# Patient Record
Sex: Female | Born: 1960 | Race: Black or African American | Hispanic: No | Smoking: Never smoker
Health system: Southern US, Community
[De-identification: ages and names within clinical notes are randomized; demographics above are authoritative.]

## PROBLEM LIST (undated history)

## (undated) DIAGNOSIS — I10 Essential (primary) hypertension: Secondary | ICD-10-CM

---

## 2006-07-22 ENCOUNTER — Emergency Department: Payer: Self-pay | Admitting: Unknown Physician Specialty

## 2007-01-12 ENCOUNTER — Ambulatory Visit: Payer: Self-pay | Admitting: *Deleted

## 2008-09-26 ENCOUNTER — Emergency Department: Payer: Self-pay | Admitting: Emergency Medicine

## 2009-05-03 ENCOUNTER — Ambulatory Visit: Payer: Self-pay | Admitting: Family Medicine

## 2010-05-08 ENCOUNTER — Ambulatory Visit: Payer: Self-pay | Admitting: Family Medicine

## 2010-05-10 ENCOUNTER — Ambulatory Visit: Payer: Self-pay | Admitting: Family Medicine

## 2010-11-11 ENCOUNTER — Ambulatory Visit: Payer: Self-pay | Admitting: Family Medicine

## 2012-01-01 ENCOUNTER — Ambulatory Visit: Payer: Self-pay | Admitting: Family Medicine

## 2012-01-07 ENCOUNTER — Ambulatory Visit: Payer: Self-pay | Admitting: Family Medicine

## 2012-02-09 ENCOUNTER — Ambulatory Visit: Payer: Self-pay | Admitting: Gastroenterology

## 2012-02-10 LAB — PATHOLOGY REPORT

## 2012-10-06 HISTORY — PX: BREAST BIOPSY: SHX20

## 2012-12-09 ENCOUNTER — Ambulatory Visit: Payer: Self-pay | Admitting: Family Medicine

## 2013-01-24 ENCOUNTER — Ambulatory Visit: Payer: Self-pay | Admitting: Surgery

## 2013-07-29 ENCOUNTER — Ambulatory Visit: Payer: Self-pay | Admitting: Surgery

## 2013-11-21 ENCOUNTER — Ambulatory Visit: Payer: Self-pay | Admitting: Orthopedic Surgery

## 2014-12-21 ENCOUNTER — Ambulatory Visit: Payer: Self-pay | Admitting: Nurse Practitioner

## 2017-12-17 ENCOUNTER — Other Ambulatory Visit: Payer: Self-pay | Admitting: Family Medicine

## 2017-12-17 DIAGNOSIS — Z1231 Encounter for screening mammogram for malignant neoplasm of breast: Secondary | ICD-10-CM

## 2018-01-14 ENCOUNTER — Ambulatory Visit
Admission: RE | Admit: 2018-01-14 | Discharge: 2018-01-14 | Disposition: A | Discharge status: Home / Self Care | Payer: 59 | Source: Ambulatory Visit | Attending: Family Medicine | Admitting: Family Medicine

## 2018-01-14 ENCOUNTER — Encounter: Payer: Self-pay | Admitting: Radiology

## 2018-01-14 DIAGNOSIS — R928 Other abnormal and inconclusive findings on diagnostic imaging of breast: Secondary | ICD-10-CM | POA: Insufficient documentation

## 2018-01-14 DIAGNOSIS — Z1231 Encounter for screening mammogram for malignant neoplasm of breast: Secondary | ICD-10-CM | POA: Insufficient documentation

## 2018-01-14 DIAGNOSIS — R921 Mammographic calcification found on diagnostic imaging of breast: Secondary | ICD-10-CM | POA: Diagnosis not present

## 2018-01-25 ENCOUNTER — Other Ambulatory Visit: Payer: Self-pay | Admitting: Family Medicine

## 2018-01-25 DIAGNOSIS — R928 Other abnormal and inconclusive findings on diagnostic imaging of breast: Secondary | ICD-10-CM

## 2018-01-25 DIAGNOSIS — R921 Mammographic calcification found on diagnostic imaging of breast: Secondary | ICD-10-CM

## 2018-01-29 ENCOUNTER — Ambulatory Visit
Admission: RE | Admit: 2018-01-29 | Discharge: 2018-01-29 | Disposition: A | Discharge status: Home / Self Care | Payer: 59 | Source: Ambulatory Visit | Attending: Family Medicine | Admitting: Family Medicine

## 2018-01-29 DIAGNOSIS — R921 Mammographic calcification found on diagnostic imaging of breast: Secondary | ICD-10-CM

## 2018-01-29 DIAGNOSIS — R928 Other abnormal and inconclusive findings on diagnostic imaging of breast: Secondary | ICD-10-CM | POA: Diagnosis present

## 2018-02-02 ENCOUNTER — Other Ambulatory Visit: Payer: Self-pay | Admitting: Family Medicine

## 2018-02-02 DIAGNOSIS — R921 Mammographic calcification found on diagnostic imaging of breast: Secondary | ICD-10-CM

## 2018-02-02 DIAGNOSIS — R928 Other abnormal and inconclusive findings on diagnostic imaging of breast: Secondary | ICD-10-CM

## 2018-02-11 ENCOUNTER — Ambulatory Visit
Admission: RE | Admit: 2018-02-11 | Discharge: 2018-02-11 | Disposition: A | Discharge status: Home / Self Care | Payer: 59 | Source: Ambulatory Visit | Attending: Family Medicine | Admitting: Family Medicine

## 2018-02-11 DIAGNOSIS — R928 Other abnormal and inconclusive findings on diagnostic imaging of breast: Secondary | ICD-10-CM | POA: Diagnosis present

## 2018-02-11 DIAGNOSIS — R921 Mammographic calcification found on diagnostic imaging of breast: Secondary | ICD-10-CM | POA: Diagnosis not present

## 2018-02-11 HISTORY — PX: BREAST BIOPSY: SHX20

## 2018-02-12 LAB — SURGICAL PATHOLOGY

## 2018-05-21 ENCOUNTER — Emergency Department
Admission: EM | Admit: 2018-05-21 | Discharge: 2018-05-21 | Disposition: A | Discharge status: Home / Self Care | Payer: 59 | Attending: Emergency Medicine | Admitting: Emergency Medicine

## 2018-05-21 ENCOUNTER — Other Ambulatory Visit: Payer: Self-pay

## 2018-05-21 DIAGNOSIS — I10 Essential (primary) hypertension: Secondary | ICD-10-CM | POA: Diagnosis not present

## 2018-05-21 DIAGNOSIS — S90861A Insect bite (nonvenomous), right foot, initial encounter: Secondary | ICD-10-CM | POA: Insufficient documentation

## 2018-05-21 DIAGNOSIS — Z79899 Other long term (current) drug therapy: Secondary | ICD-10-CM | POA: Insufficient documentation

## 2018-05-21 DIAGNOSIS — Y929 Unspecified place or not applicable: Secondary | ICD-10-CM | POA: Insufficient documentation

## 2018-05-21 DIAGNOSIS — Y999 Unspecified external cause status: Secondary | ICD-10-CM | POA: Diagnosis not present

## 2018-05-21 DIAGNOSIS — W57XXXA Bitten or stung by nonvenomous insect and other nonvenomous arthropods, initial encounter: Secondary | ICD-10-CM | POA: Insufficient documentation

## 2018-05-21 DIAGNOSIS — L509 Urticaria, unspecified: Secondary | ICD-10-CM | POA: Diagnosis present

## 2018-05-21 DIAGNOSIS — Y939 Activity, unspecified: Secondary | ICD-10-CM | POA: Diagnosis not present

## 2018-05-21 DIAGNOSIS — T7840XA Allergy, unspecified, initial encounter: Secondary | ICD-10-CM | POA: Diagnosis not present

## 2018-05-21 HISTORY — DX: Essential (primary) hypertension: I10

## 2018-05-21 MED ORDER — EPINEPHRINE 0.3 MG/0.3ML IJ SOAJ: 0.3000 mg | Freq: Once | INTRAMUSCULAR | 0 refills | Status: AC

## 2018-05-21 MED ORDER — PREDNISONE 50 MG PO TABS: ORAL_TABLET | ORAL | 0 refills | Status: DC

## 2018-05-21 MED ORDER — FAMOTIDINE 20 MG PO TABS
40.0000 mg | ORAL_TABLET | Freq: Once | ORAL | Status: AC
  Administered 2018-05-21: 40 mg via ORAL
  Filled 2018-05-21: qty 2

## 2018-05-21 MED ORDER — PREDNISONE 20 MG PO TABS
60.0000 mg | ORAL_TABLET | Freq: Once | ORAL | Status: AC
  Administered 2018-05-21: 60 mg via ORAL
  Filled 2018-05-21: qty 3

## 2018-05-21 MED ORDER — DIPHENHYDRAMINE HCL 25 MG PO CAPS: 25.0000 mg | ORAL_CAPSULE | Freq: Four times a day (QID) | ORAL | 0 refills | Status: AC | PRN

## 2018-05-21 MED ORDER — DIPHENHYDRAMINE HCL 25 MG PO CAPS
50.0000 mg | ORAL_CAPSULE | Freq: Once | ORAL | Status: AC
  Administered 2018-05-21: 50 mg via ORAL
  Filled 2018-05-21: qty 2

## 2018-05-21 MED ORDER — FAMOTIDINE 20 MG PO TABS: 20.0000 mg | ORAL_TABLET | Freq: Two times a day (BID) | ORAL | 1 refills | Status: AC

## 2018-05-21 NOTE — ED Provider Notes (Signed)
[facility] Emergency Department Provider Note  ____________________________________________  Time seen: Approximately 6:52 PM  I have reviewed the triage vital signs and the nursing notes.   HISTORY  Chief Complaint Allergic Reaction    HPI Cynthia Khan is a 57 y.o. female presents to the emergency department after being bitten by an insect at approximately 4:00 PM.  Patient was bitten along the dorsal aspect of her right foot.  Patient reports that she felt a pinching sensation.  Shortly afterwards, patient noticed hives of the bilateral under arms, chest and back.  Patient denies dysphasia, shortness of breath, chest tightness, chest pain, nausea, vomiting, abdominal discomfort and syncope.  No prior history of anaphylaxis.  No alleviating measures have been attempted.   Past Medical History:  Diagnosis Date  . Hypertension     There are no active problems to display for this patient.   Past Surgical History:  Procedure Laterality Date  . BREAST BIOPSY Right 2014   benign  . BREAST BIOPSY Left 02/11/2018   stereo bx calcs path pending ribbon clip    Prior to Admission medications   Medication Sig Start Date End Date Taking? Authorizing Provider  atenolol (TENORMIN) 25 MG tablet Take 25 mg by mouth daily.   Yes [provider]  hydrochlorothiazide (HYDRODIURIL) 25 MG tablet Take 25 mg by mouth daily.   Yes [provider]  diphenhydrAMINE (BENADRYL ALLERGY) 25 mg capsule Take 1 capsule (25 mg total) by mouth every 6 (six) hours as needed for up to 5 days. 05/21/18 05/26/18  Orvil FeilWoods, Jaclyn M, PA-C  EPINEPHrine (EPIPEN 2-PAK) 0.3 mg/0.3 mL IJ SOAJ injection Inject 0.3 mLs (0.3 mg total) into the muscle once for 1 dose. 05/21/18 05/21/18  Orvil FeilWoods, Jaclyn M, PA-C  famotidine (PEPCID) 20 MG tablet Take 1 tablet (20 mg total) by mouth 2 (two) times daily for 5 days. 05/21/18 05/26/18  Orvil FeilWoods, Jaclyn M, PA-C  predniSONE (DELTASONE) 50 MG tablet  Take one 50 mg tablet once daily for the next five days. 05/21/18   Orvil FeilWoods, Jaclyn M, PA-C    Allergies Sulfa antibiotics  Family History  Problem Relation Age of Onset  . Breast cancer Maternal Grandmother     Social History Social History   Tobacco Use  . Smoking status: Never Smoker  . Smokeless tobacco: Never Used  Substance Use Topics  . Alcohol use: Yes    Alcohol/week: 1.0 standard drinks    Types: 1 Glasses of wine per week    Frequency: Never  . Drug use: Never     Review of Systems  Constitutional: No fever/chills Eyes: No visual changes. No discharge ENT: No upper respiratory complaints. Cardiovascular: no chest pain. Respiratory: no cough. No SOB. Gastrointestinal: No abdominal pain.  No nausea, no vomiting.  No diarrhea.  No constipation. Genitourinary: Negative for dysuria. No hematuria Musculoskeletal: Negative for musculoskeletal pain. Skin: Patient has insect bite and urticaria. Neurological: Negative for headaches, focal weakness or numbness.   ____________________________________________   PHYSICAL EXAM:  VITAL SIGNS: ED Triage Vitals [05/21/18 1811]  Enc Vitals Group     BP (!) 149/81     Pulse Rate 80     Resp 18     Temp 98.7 F (37.1 C)     Temp Source Oral     SpO2 98 %     Weight 165 lb (74.8 kg)     Height 5\' 1"  (1.549 m)     Head Circumference  Peak Flow      Pain Score      Pain Loc      Pain Edu?      Excl. in GC?      Constitutional: Alert and oriented. Well appearing and in no acute distress. Eyes: Conjunctivae are normal. PERRL. EOMI. Head: Atraumatic. ENT:      Ears: TMs are pearly.      Nose: No congestion/rhinnorhea.      Mouth/Throat: Mucous membranes are moist.  Neck: No stridor.  No cervical spine tenderness to palpation. Hematological/Lymphatic/Immunilogical: No cervical lymphadenopathy.  Cardiovascular: Normal rate, regular rhythm. Normal S1 and S2.  Good peripheral circulation. Respiratory: Normal  respiratory effort without tachypnea or retractions. Lungs CTAB. Good air entry to the bases with no decreased or absent breath sounds. Gastrointestinal: Bowel sounds 4 quadrants. Soft and nontender to palpation. No guarding or rigidity. No palpable masses. No distention. No CVA tenderness. Musculoskeletal: Full range of motion to all extremities. No gross deformities appreciated. Neurologic:  Normal speech and language. No gross focal neurologic deficits are appreciated.  Skin: Patient has urticaria of the bilateral underarms, chest and back.  Patient has edema and erythema overlying the dorsal aspect of the right foot.  ______________________________________  LABS (all labs ordered are listed, but only abnormal results are displayed)  Labs Reviewed - No data to display ____________________________________________  EKG   ____________________________________________  RADIOLOGY   No results found.  ____________________________________________    PROCEDURES  Procedure(s) performed:    Procedures    Medications  diphenhydrAMINE (BENADRYL) capsule 50 mg (50 mg Oral Given 05/21/18 1858)  predniSONE (DELTASONE) tablet 60 mg (60 mg Oral Given 05/21/18 1857)  famotidine (PEPCID) tablet 40 mg (40 mg Oral Given 05/21/18 1858)     ____________________________________________   INITIAL IMPRESSION / ASSESSMENT AND PLAN / ED COURSE  Pertinent labs & imaging results that were available during my care of the patient were reviewed by me and considered in my medical decision making (see chart for details).  Review of the [place] CSRS was performed in accordance of the [facility] prior to dispensing any controlled drugs.    Assessment and Plan:  Allergic reaction Patient presents to the emergency department with erythema and edema at the dorsal aspect of the right foot with urticaria under the arms and at the chest and back.  Patient was given prednisone, famotidine and Benadryl in the  emergency department.  Patient has not experienced dysphagia, shortness of breath, chest tightness, nausea, diarrhea, vomiting or syncope.  Patient education regarding the course of anaphylaxis was given and patient voiced understanding.  Patient was discharged with Benadryl, famotidine and prednisone as well as an EpiPen.  Vital signs remained reassuring throughout emergency department course.  All patient questions were answered.   ____________________________________________  FINAL CLINICAL IMPRESSION(S) / ED DIAGNOSES  Final diagnoses:  Allergic reaction, initial encounter      NEW MEDICATIONS STARTED DURING THIS VISIT:  ED Discharge Orders         Ordered    predniSONE (DELTASONE) 50 MG tablet     05/21/18 1900    diphenhydrAMINE (BENADRYL ALLERGY) 25 mg capsule  Every 6 hours PRN     05/21/18 1900    famotidine (PEPCID) 20 MG tablet  2 times daily     05/21/18 1900    EPINEPHrine (EPIPEN 2-PAK) 0.3 mg/0.3 mL IJ SOAJ injection   Once     05/21/18 1900  This chart was dictated using voice recognition software/Dragon. Despite best efforts to proofread, errors can occur which can change the meaning. Any change was purely unintentional.    Orvil Feil, PA-C 05/21/18 1901    Nita Sickle, MD 05/21/18 [phone]

## 2018-05-21 NOTE — ED Notes (Signed)
Pt verbalizes understanding of discharge instructions.

## 2018-05-21 NOTE — ED Triage Notes (Signed)
Pt was bitten by something on her right foot around 1600. Foot is swollen and pt has hives on her chest and back area. C/o itching and swelling. Pt did not see what bit her. Lung sounds clear and equal. No feelings of trouble breathing. No meds taken at home.

## 2018-09-27 ENCOUNTER — Encounter: Payer: Self-pay | Admitting: Emergency Medicine

## 2018-09-27 ENCOUNTER — Other Ambulatory Visit: Payer: Self-pay

## 2018-09-27 ENCOUNTER — Emergency Department: Payer: 59

## 2018-09-27 ENCOUNTER — Emergency Department
Admission: EM | Admit: 2018-09-27 | Discharge: 2018-09-27 | Disposition: A | Discharge status: Home / Self Care | Payer: 59 | Attending: Emergency Medicine | Admitting: Emergency Medicine

## 2018-09-27 DIAGNOSIS — R200 Anesthesia of skin: Secondary | ICD-10-CM | POA: Diagnosis not present

## 2018-09-27 DIAGNOSIS — E876 Hypokalemia: Secondary | ICD-10-CM | POA: Diagnosis not present

## 2018-09-27 DIAGNOSIS — M79605 Pain in left leg: Secondary | ICD-10-CM | POA: Insufficient documentation

## 2018-09-27 DIAGNOSIS — Z79899 Other long term (current) drug therapy: Secondary | ICD-10-CM | POA: Diagnosis not present

## 2018-09-27 DIAGNOSIS — M50122 Cervical disc disorder at C5-C6 level with radiculopathy: Secondary | ICD-10-CM | POA: Diagnosis not present

## 2018-09-27 DIAGNOSIS — I10 Essential (primary) hypertension: Secondary | ICD-10-CM | POA: Insufficient documentation

## 2018-09-27 DIAGNOSIS — M79652 Pain in left thigh: Secondary | ICD-10-CM | POA: Diagnosis present

## 2018-09-27 DIAGNOSIS — R202 Paresthesia of skin: Secondary | ICD-10-CM | POA: Diagnosis not present

## 2018-09-27 LAB — CBC WITH DIFFERENTIAL/PLATELET
Abs Immature Granulocytes: 0.01 10*3/uL (ref 0.00–0.07)
Basophils Absolute: 0 10*3/uL (ref 0.0–0.1)
Basophils Relative: 1 %
EOS ABS: 0.1 10*3/uL (ref 0.0–0.5)
Eosinophils Relative: 2 %
HEMATOCRIT: 38.2 % (ref 36.0–46.0)
HEMOGLOBIN: 11.9 g/dL — AB (ref 12.0–15.0)
Immature Granulocytes: 0 %
LYMPHS ABS: 2.4 10*3/uL (ref 0.7–4.0)
LYMPHS PCT: 43 %
MCH: 22.6 pg — AB (ref 26.0–34.0)
MCHC: 31.2 g/dL (ref 30.0–36.0)
MCV: 72.5 fL — AB (ref 80.0–100.0)
MONO ABS: 0.6 10*3/uL (ref 0.1–1.0)
Monocytes Relative: 11 %
NRBC: 0 % (ref 0.0–0.2)
Neutro Abs: 2.3 10*3/uL (ref 1.7–7.7)
Neutrophils Relative %: 43 %
Platelets: 173 10*3/uL (ref 150–400)
RBC: 5.27 MIL/uL — ABNORMAL HIGH (ref 3.87–5.11)
RDW: 15.3 % (ref 11.5–15.5)
WBC: 5.5 10*3/uL (ref 4.0–10.5)

## 2018-09-27 LAB — BASIC METABOLIC PANEL
Anion gap: 10 (ref 5–15)
BUN: 15 mg/dL (ref 6–20)
CALCIUM: 9.6 mg/dL (ref 8.9–10.3)
CHLORIDE: 101 mmol/L (ref 98–111)
CO2: 27 mmol/L (ref 22–32)
Creatinine, Ser: 0.7 mg/dL (ref 0.44–1.00)
GFR calc Af Amer: >60 mL/min (ref >60)
GFR calc non Af Amer: >60 mL/min (ref >60)
GLUCOSE: 93 mg/dL (ref 70–99)
Potassium: 3.4 mmol/L — ABNORMAL LOW (ref 3.5–5.1)
Sodium: 138 mmol/L (ref 135–145)

## 2018-09-27 LAB — SEDIMENTATION RATE: Sed Rate: 30 mm/hr (ref 0–30)

## 2018-09-27 NOTE — ED Notes (Signed)
FIRST NURSE NOTE:  Left leg pain at night for the past 4 weeks, states pain got worse last night, pt is ambulatory without difficulty in lobby.  States she has difficulty sleeping due to the pain.

## 2018-09-27 NOTE — ED Provider Notes (Signed)
[facility] Emergency Department Provider Note   ____________________________________________   First MD Initiated Contact with Patient 09/27/18 [phone]     (approximate)  I have reviewed the triage vital signs and the nursing notes.   HISTORY  Chief Complaint Leg Pain    HPI Cynthia Khan is a 57 y.o. female patient complaining of increasing left thigh pain for 4 weeks.  Patient the pain is increased when she lays down.  Patient denies chest pain or shortness of breath.  Patient also complain intimating numbness and tingling to the left upper extremity.  Patient denies vision disturbance, facial weakness, or vertigo.  Patient rates her pain as a 3/10.  Patient not discussed this complaint of her PCP.  No palliative measures for complaint.  Patient states she is concerned for DVT.  Past Medical History:  Diagnosis Date  . Hypertension     There are no active problems to display for this patient.   Past Surgical History:  Procedure Laterality Date  . BREAST BIOPSY Right 2014   benign  . BREAST BIOPSY Left 02/11/2018   stereo bx calcs path pending ribbon clip    Prior to Admission medications   Medication Sig Start Date End Date Taking? Authorizing Provider  atenolol (TENORMIN) 25 MG tablet Take 25 mg by mouth daily.    [provider]  diphenhydrAMINE (BENADRYL ALLERGY) 25 mg capsule Take 1 capsule (25 mg total) by mouth every 6 (six) hours as needed for up to 5 days. 05/21/18 05/26/18  Orvil Feil, PA-C  famotidine (PEPCID) 20 MG tablet Take 1 tablet (20 mg total) by mouth 2 (two) times daily for 5 days. 05/21/18 05/26/18  Orvil Feil, PA-C  hydrochlorothiazide (HYDRODIURIL) 25 MG tablet Take 25 mg by mouth daily.    [provider]  predniSONE (DELTASONE) 50 MG tablet Take one 50 mg tablet once daily for the next five days. 05/21/18   Orvil Feil, PA-C    Allergies Sulfa antibiotics  Family History  Problem Relation  Age of Onset  . Breast cancer Maternal Grandmother     Social History Social History   Tobacco Use  . Smoking status: Never Smoker  . Smokeless tobacco: Never Used  Substance Use Topics  . Alcohol use: Yes    Alcohol/week: 1.0 standard drinks    Types: 1 Glasses of wine per week    Frequency: Never  . Drug use: Never    Review of Systems Constitutional: No fever/chills Eyes: No visual changes. ENT: No sore throat. Cardiovascular: Denies chest pain. Respiratory: Denies shortness of breath. Gastrointestinal: No abdominal pain.  No nausea, no vomiting.  No diarrhea.  No constipation. Genitourinary: Negative for dysuria. Musculoskeletal: Left leg pain.. Skin: Negative for rash. Neurological: Negative for headaches, focal numbness to left upper extremity. Allergic/Immunilogical: Sulfa antibiotics.  ____________________________________________   PHYSICAL EXAM:  VITAL SIGNS: ED Triage Vitals  Enc Vitals Group     BP 09/27/18 0913 116/64     Pulse Rate 09/27/18 0913 (!) 58     Resp 09/27/18 0913 16     Temp 09/27/18 0913 98.2 F (36.8 C)     Temp Source 09/27/18 0913 Oral     SpO2 09/27/18 0913 100 %     Weight 09/27/18 0914 168 lb (76.2 kg)     Height 09/27/18 0914 5\' 1"  (1.549 m)     Head Circumference --      Peak Flow --      Pain  Score 09/27/18 0920 3     Pain Loc --      Pain Edu? --      Excl. in GC? --    Constitutional: Alert and oriented. Well appearing and in no acute distress. Eyes: Conjunctivae are normal. PERRL. EOMI. Neck: No stridor.  No cervical spine tenderness to palpation. Hematological/Lymphatic/Immunilogical: No cervical lymphadenopathy. Cardiovascular: Normal rate, regular rhythm. Grossly normal heart sounds.  Good peripheral circulation. Respiratory: Normal respiratory effort.  No retractions. Lungs CTAB. Musculoskeletal: No deformity upper extremities.  Full and equal range of motion.  Strength against resistance 5/5 bilateral.  No lower  extremity deformity.  No edema or ecchymosis.  Nontender to palpation. Neurologic:  Normal speech and language. No gross focal neurologic deficits are appreciated. No gait instability. Skin:  Skin is warm, dry and intact. No rash noted. Psychiatric: Mood and affect are normal. Speech and behavior are normal.  ____________________________________________   LABS (all labs ordered are listed, but only abnormal results are displayed)  Labs Reviewed  CBC WITH DIFFERENTIAL/PLATELET - Abnormal; Notable for the following components:      Result Value   RBC 5.27 (*)    Hemoglobin 11.9 (*)    MCV 72.5 (*)    MCH 22.6 (*)    All other components within normal limits  BASIC METABOLIC PANEL - Abnormal; Notable for the following components:   Potassium 3.4 (*)    All other components within normal limits  SEDIMENTATION RATE   ____________________________________________  EKG   ____________________________________________  RADIOLOGY  ED MD interpretation:    Official radiology report(s): Dg Cervical Spine 2-3 Views  Result Date: 09/27/2018 CLINICAL DATA:  Left leg pain. EXAM: CERVICAL SPINE - 2-3 VIEW COMPARISON:  None. FINDINGS: Early degenerative disc disease at C5-6 with disc space narrowing and spurring. Normal alignment. Prevertebral soft tissues are normal. No fracture. IMPRESSION: Early degenerative changes as above.  No acute bony abnormality. Electronically Signed   By: Charlett NoseKevin  Dover M.D.   On: 09/27/2018 10:10   [place] Venous Img Lower Unilateral Left  Result Date: 09/27/2018 CLINICAL DATA:  57 year old female with progressive left lower extremity pain for the past 4 weeks. EXAM: LEFT LOWER EXTREMITY VENOUS DOPPLER ULTRASOUND TECHNIQUE: Gray-scale sonography with graded compression, as well as color Doppler and duplex ultrasound were performed to evaluate the lower extremity deep venous systems from the level of the common femoral vein and including the common femoral, femoral,  profunda femoral, popliteal and calf veins including the posterior tibial, peroneal and gastrocnemius veins when visible. The superficial great saphenous vein was also interrogated. Spectral Doppler was utilized to evaluate flow at rest and with distal augmentation maneuvers in the common femoral, femoral and popliteal veins. COMPARISON:  None. FINDINGS: Contralateral Common Femoral Vein: Respiratory phasicity is normal and symmetric with the symptomatic side. No evidence of thrombus. Normal compressibility. Common Femoral Vein: No evidence of thrombus. Normal compressibility, respiratory phasicity and response to augmentation. Saphenofemoral Junction: No evidence of thrombus. Normal compressibility and flow on color Doppler imaging. Profunda Femoral Vein: No evidence of thrombus. Normal compressibility and flow on color Doppler imaging. Femoral Vein: No evidence of thrombus. Normal compressibility, respiratory phasicity and response to augmentation. Popliteal Vein: No evidence of thrombus. Normal compressibility, respiratory phasicity and response to augmentation. Calf Veins: No evidence of thrombus. Normal compressibility and flow on color Doppler imaging. Superficial Great Saphenous Vein: No evidence of thrombus. Normal compressibility. Venous Reflux:  None. Other Findings:  None. IMPRESSION: No evidence of deep venous thrombosis. Electronically Signed  By: Malachy MoanHeath  McCullough M.D.   On: 09/27/2018 11:30    ____________________________________________   PROCEDURES  Procedure(s) performed: None  Procedures  Critical Care performed: No  ____________________________________________   INITIAL IMPRESSION / ASSESSMENT AND PLAN / ED COURSE  As part of my medical decision making, I reviewed the following data within the electronic MEDICAL RECORD NUMBER    Patient presents with a cough for the left upper extremity and left leg pain.  Voices concern for DVT which was alleviated by discussing negative  ultrasound.  Patient potassium was found to be slightly low but patient refused medication at this time.  Patient got very is consistent with degenerated disc disease of the cervical spine.  Patient states she has a appointment with PCP at end of week and will discuss her ER visit.      ____________________________________________   FINAL CLINICAL IMPRESSION(S) / ED DIAGNOSES  Final diagnoses:  Cervical disc disorder at C5-C6 level with radiculopathy  Hypokalemia  Pain of left lower extremity     ED Discharge Orders    None       Note:  This document was prepared using Dragon voice recognition software and may include unintentional dictation errors.    Joni ReiningSmith, Diago Haik K, PA-C 09/27/18 1149    Schaevitz, Myra Rudeavid Matthew, MD 09/27/18 [phone]

## 2018-09-27 NOTE — ED Notes (Signed)
See triage note  Presents with pain to left upper leg  Pain is anterior/lateral  Pain is moving from groin down to knee  Ambulates well  Also felt some numbness to left arm this am   numbness is gone at present  Good grips

## 2018-09-27 NOTE — ED Triage Notes (Signed)
Pt with left thigh pain x 4 weeks that is worse when laying down. Pt denies pain distal to area and pain does not radiate. Pt states that last night her left shoulder was sore also.

## 2019-03-31 ENCOUNTER — Telehealth: Payer: Self-pay | Admitting: *Deleted

## 2019-03-31 ENCOUNTER — Other Ambulatory Visit: Payer: 59

## 2019-03-31 DIAGNOSIS — Z20822 Contact with and (suspected) exposure to covid-19: Secondary | ICD-10-CM

## 2019-03-31 NOTE — Telephone Encounter (Signed)
Referred for covid testing by Dr. Blanchard Kelch at the [facility]. Reports no sxs. Informed to wear a mask and stay in vehicle. Appointment made for this morning at 9:15a at the [place] site.

## 2019-04-04 LAB — NOVEL CORONAVIRUS, NAA: SARS-CoV-2, NAA: NOT DETECTED

## 2019-04-15 ENCOUNTER — Telehealth: Payer: Self-pay | Admitting: General Practice

## 2019-04-15 NOTE — Telephone Encounter (Signed)
Pt called for results/ let pt know it was negative.

## 2019-08-17 ENCOUNTER — Other Ambulatory Visit: Payer: Self-pay

## 2019-08-17 DIAGNOSIS — Z20822 Contact with and (suspected) exposure to covid-19: Secondary | ICD-10-CM

## 2019-08-19 LAB — NOVEL CORONAVIRUS, NAA: SARS-CoV-2, NAA: DETECTED — AB

## 2020-05-09 ENCOUNTER — Emergency Department
Admission: EM | Admit: 2020-05-09 | Discharge: 2020-05-09 | Disposition: A | Discharge status: Home / Self Care | Payer: BC Managed Care – PPO | Attending: Student in an Organized Health Care Education/Training Program | Admitting: Student in an Organized Health Care Education/Training Program

## 2020-05-09 ENCOUNTER — Other Ambulatory Visit: Payer: Self-pay

## 2020-05-09 DIAGNOSIS — Z23 Encounter for immunization: Secondary | ICD-10-CM | POA: Insufficient documentation

## 2020-05-09 DIAGNOSIS — S61411A Laceration without foreign body of right hand, initial encounter: Secondary | ICD-10-CM | POA: Diagnosis present

## 2020-05-09 DIAGNOSIS — W260XXA Contact with knife, initial encounter: Secondary | ICD-10-CM | POA: Insufficient documentation

## 2020-05-09 DIAGNOSIS — Y999 Unspecified external cause status: Secondary | ICD-10-CM | POA: Diagnosis not present

## 2020-05-09 DIAGNOSIS — I10 Essential (primary) hypertension: Secondary | ICD-10-CM | POA: Insufficient documentation

## 2020-05-09 DIAGNOSIS — Y93G3 Activity, cooking and baking: Secondary | ICD-10-CM | POA: Diagnosis not present

## 2020-05-09 DIAGNOSIS — Y92090 Kitchen in other non-institutional residence as the place of occurrence of the external cause: Secondary | ICD-10-CM | POA: Diagnosis not present

## 2020-05-09 MED ORDER — HYDROCODONE-ACETAMINOPHEN 5-325 MG PO TABS: 1.0000 | ORAL_TABLET | Freq: Four times a day (QID) | ORAL | 0 refills | Status: DC | PRN

## 2020-05-09 MED ORDER — LIDOCAINE HCL (PF) 1 % IJ SOLN
10.0000 mL | Freq: Once | INTRAMUSCULAR | Status: AC
  Administered 2020-05-09: 10 mL
  Filled 2020-05-09: qty 10

## 2020-05-09 MED ORDER — LIDOCAINE-EPINEPHRINE-TETRACAINE (LET) TOPICAL GEL
3.0000 mL | Freq: Once | TOPICAL | Status: AC
  Administered 2020-05-09: 3 mL via TOPICAL
  Filled 2020-05-09: qty 3

## 2020-05-09 MED ORDER — TETANUS-DIPHTH-ACELL PERTUSSIS 5-2.5-18.5 LF-MCG/0.5 IM SUSP
0.5000 mL | Freq: Once | INTRAMUSCULAR | Status: AC
  Administered 2020-05-09: 0.5 mL via INTRAMUSCULAR
  Filled 2020-05-09: qty 0.5

## 2020-05-09 NOTE — ED Triage Notes (Signed)
Pt was fixing breakfast when knife slipped cutting right hand between thumb and index finger. Lac is around 1.5". wrap applied to hand, bleeding controled at this time

## 2020-05-09 NOTE — ED Provider Notes (Signed)
[facility] Emergency Department Provider Note  ____________________________________________   First MD Initiated Contact with Patient 05/09/20 1147     (approximate)  I have reviewed the triage vital signs and the nursing notes.   HISTORY  Chief Complaint Laceration   HPI Cynthia Khan is a 59 y.o. female presents to the ED with a laceration to her right hand.  Patient states that she was fixing breakfast and was trying to pry apart frozen sausage patties.  She states that the knife slipped causing a laceration to her right hand.  Patient continues to move her digits without any difficulty or restriction.  Patient reports that she wrapped a washcloth around her hand to control bleeding.  States her last tetanus booster was within 5 years.  Currently she rates her pain as 7 out of 10.      Past Medical History:  Diagnosis Date  . Hypertension     There are no problems to display for this patient.   Past Surgical History:  Procedure Laterality Date  . BREAST BIOPSY Right 2014   benign  . BREAST BIOPSY Left 02/11/2018   stereo bx calcs path pending ribbon clip    Prior to Admission medications   Medication Sig Start Date End Date Taking? Authorizing Provider  atenolol (TENORMIN) 25 MG tablet Take 25 mg by mouth daily.    [provider]  diphenhydrAMINE (BENADRYL ALLERGY) 25 mg capsule Take 1 capsule (25 mg total) by mouth every 6 (six) hours as needed for up to 5 days. 05/21/18 05/26/18  Orvil Feil, PA-C  famotidine (PEPCID) 20 MG tablet Take 1 tablet (20 mg total) by mouth 2 (two) times daily for 5 days. 05/21/18 05/26/18  Orvil Feil, PA-C  hydrochlorothiazide (HYDRODIURIL) 25 MG tablet Take 25 mg by mouth daily.    [provider]  HYDROcodone-acetaminophen (NORCO/VICODIN) 5-325 MG tablet Take 1 tablet by mouth every 6 (six) hours as needed for moderate pain. 05/09/20   Tommi Rumps, PA-C    Allergies Sulfa  antibiotics  Family History  Problem Relation Age of Onset  . Breast cancer Maternal Grandmother     Social History Social History   Tobacco Use  . Smoking status: Never Smoker  . Smokeless tobacco: Never Used  Substance Use Topics  . Alcohol use: Yes    Alcohol/week: 1.0 standard drink    Types: 1 Glasses of wine per week  . Drug use: Never    Review of Systems Constitutional: No fever/chills Eyes: No visual changes. Cardiovascular: Denies chest pain. Respiratory: Denies shortness of breath. Gastrointestinal:  No nausea, no vomiting.  Musculoskeletal: Positive for right hand pain. Skin: Laceration right hand. Neurological: Negative for headaches, focal weakness or numbness. ____________________________________________   PHYSICAL EXAM:  VITAL SIGNS: ED Triage Vitals  Enc Vitals Group     BP 05/09/20 1121 (!) 144/79     Pulse Rate 05/09/20 1121 (!) 59     Resp 05/09/20 1121 18     Temp 05/09/20 1121 98.3 F (36.8 C)     Temp src --      SpO2 05/09/20 1121 99 %     Weight 05/09/20 1122 170 lb (77.1 kg)     Height 05/09/20 1122 5\' 1"  (1.549 m)     Head Circumference --      Peak Flow --      Pain Score 05/09/20 1122 7     Pain Loc --  Pain Edu? --      Excl. in GC? --     Constitutional: Alert and oriented. Well appearing and in no acute distress. Eyes: Conjunctivae are normal. PERRL. EOMI. Head: Atraumatic. Neck: No stridor.   Cardiovascular: Normal rate, regular rhythm. Grossly normal heart sounds.  Good peripheral circulation. Respiratory: Normal respiratory effort.  No retractions. Lungs CTAB. Musculoskeletal: Examination of the right hand there is a 5.5 cm laceration volar surface in the webspace between the thumb and index finger.  Patient is able to move all digits without any difficulty.  Motor sensory function intact.  Capillary refills less than 3 seconds.  No foreign body was noted on initial exam. Neurologic:  Normal speech and language. No  gross focal neurologic deficits are appreciated.  Skin:  Skin is warm, dry and intact.  Psychiatric: Mood and affect are normal. Speech and behavior are normal.  ____________________________________________   LABS (all labs ordered are listed, but only abnormal results are displayed)  Labs Reviewed - No data to display  PROCEDURES  Procedure(s) performed (including Critical Care):  Marland KitchenMarland KitchenLaceration Repair  Date/Time: 05/09/2020 1:07 PM Performed by: Tommi Rumps, PA-C Authorized by: Tommi Rumps, PA-C   Consent:    Consent obtained:  Verbal   Consent given by:  Patient   Risks discussed:  Pain, infection and poor wound healing Anesthesia (see MAR for exact dosages):    Anesthesia method:  Topical application and local infiltration   Topical anesthetic:  LET   Local anesthetic:  Lidocaine 1% w/o epi Laceration details:    Location:  Hand   Hand location:  R palm   Length (cm):  5.5 Repair type:    Repair type:  Simple Pre-procedure details:    Preparation:  Patient was prepped and draped in usual sterile fashion Exploration:    Hemostasis achieved with:  Direct pressure and LET   Wound exploration: wound explored through full range of motion     Contaminated: no   Treatment:    Area cleansed with:  Saline   Amount of cleaning:  Extensive   Irrigation solution:  Sterile saline   Irrigation volume:  100   Irrigation method:  Syringe   Visualized foreign bodies/material removed: no   Skin repair:    Repair method:  Sutures   Suture size:  5-0   Suture material:  Nylon   Suture technique:  Simple interrupted and horizontal mattress   Number of sutures:  11 Approximation:    Approximation:  Close Post-procedure details:    Dressing:  Non-adherent dressing   Patient tolerance of procedure:  Tolerated well, no immediate complications Comments:     8 simple sutures and 3 horizontal mattress The 8 simple interrupted sutures were done by Gwenlyn Fudge, PAS with  supervision     ____________________________________________   INITIAL IMPRESSION / ASSESSMENT AND PLAN / ED COURSE  As part of my medical decision making, I reviewed the following data within the electronic MEDICAL RECORD NUMBER Notes from prior ED visits and [place] Controlled Substance Database  JHADA RISK was evaluated in Emergency Department on 05/09/2020 for the symptoms described in the history of present illness. She was evaluated in the context of the global COVID-19 pandemic, which necessitated consideration that the patient might be at risk for infection with the SARS-CoV-2 virus that causes COVID-19. Institutional protocols and algorithms that pertain to the evaluation of patients at risk for COVID-19 are in a state of rapid change based on information released by  regulatory bodies including the CDC and federal and state organizations. These policies and algorithms were followed during the patient's care in the ED.  59 year old female presents to the ED with laceration to her right hand that occurred at home while she was trying to pry frozen sausage patties from each other.  Patient had a 5.5 cm laceration to her right palm.  Tendons were intact, motor or sensory function intact and capillary refill is less than 3 seconds.  Patient tolerated procedure well and area was sutured until closed.  Bleeding was controlled.  Patient was instructed to elevate her hand today.  She was given a prescription for Norco every 6 hours as needed for pain.  Patient was given a note to remain out of work until Saturday at which time she can return to work with emphasis on keeping her hand clean and dry.  Patient return to the emergency department if any severe worsening of her symptoms or concerns of infection to her hand. ____________________________________________   FINAL CLINICAL IMPRESSION(S) / ED DIAGNOSES  Final diagnoses:  Laceration of right hand without foreign body, initial encounter     ED  Discharge Orders         Ordered    HYDROcodone-acetaminophen (NORCO/VICODIN) 5-325 MG tablet  Every 6 hours PRN     Discontinue  Reprint     05/09/20 1318           Note:  This document was prepared using Dragon voice recognition software and may include unintentional dictation errors.    Tommi Rumps, PA-C 05/09/20 1551    Willy Eddy, MD 05/09/20 [phone]

## 2020-05-09 NOTE — Discharge Instructions (Signed)
Follow-up with your primary care provider, urgent care or return to the emergency department for suture removal in approximately 12 days.  Watch for any signs of infection.  Begin taking pain medication as needed.  Elevate hand to reduce swelling which will also help control pain.  If any signs of infection return to the emergency department.  Clean the wound daily with mild soap and water and allowed to dry before placing another dressing on it.

## 2020-05-09 NOTE — ED Notes (Signed)
See triage note  States she was slicing something  Knife slipped  Laceration noted to web space on right hand

## 2020-09-11 ENCOUNTER — Other Ambulatory Visit: Payer: Self-pay | Admitting: Family Medicine

## 2020-09-11 DIAGNOSIS — Z1231 Encounter for screening mammogram for malignant neoplasm of breast: Secondary | ICD-10-CM

## 2020-09-13 ENCOUNTER — Ambulatory Visit
Admission: RE | Admit: 2020-09-13 | Discharge: 2020-09-13 | Disposition: A | Discharge status: Home / Self Care | Payer: BC Managed Care – PPO | Source: Ambulatory Visit | Attending: Family Medicine | Admitting: Family Medicine

## 2020-09-13 ENCOUNTER — Other Ambulatory Visit: Payer: Self-pay

## 2020-09-13 DIAGNOSIS — Z1231 Encounter for screening mammogram for malignant neoplasm of breast: Secondary | ICD-10-CM | POA: Insufficient documentation

## 2021-02-14 IMAGING — MG DIGITAL SCREENING BILAT W/ TOMO W/ CAD
8 series · 8 of 24 positions shown · non-contrast
Comparison: Previous exam(s).

CLINICAL DATA: Screening.

EXAM:
DIGITAL SCREENING BILATERAL MAMMOGRAM WITH TOMO AND CAD

[R CC synth-2D]
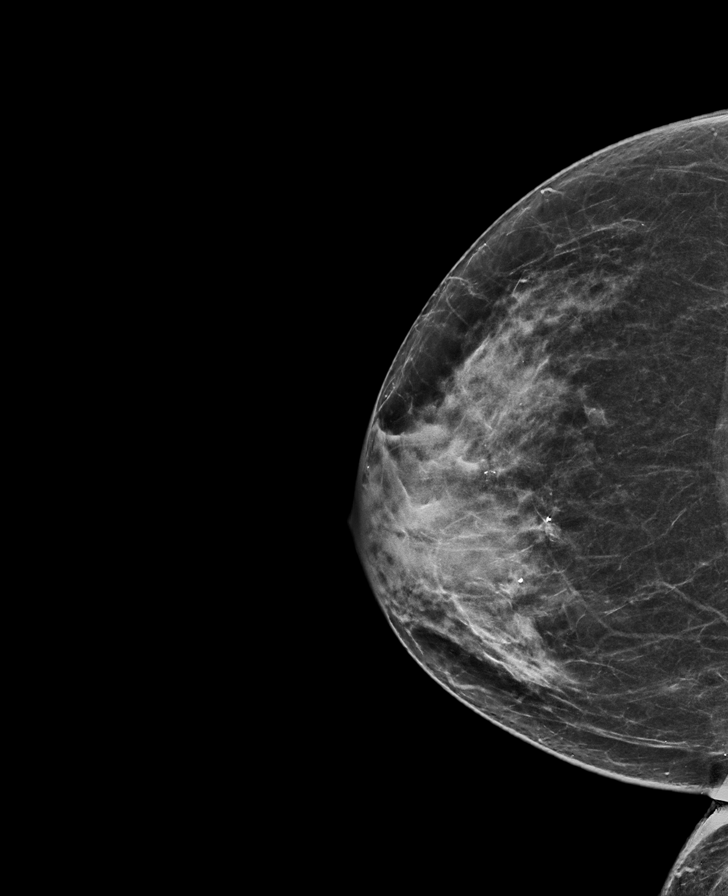

[L CC synth-2D]
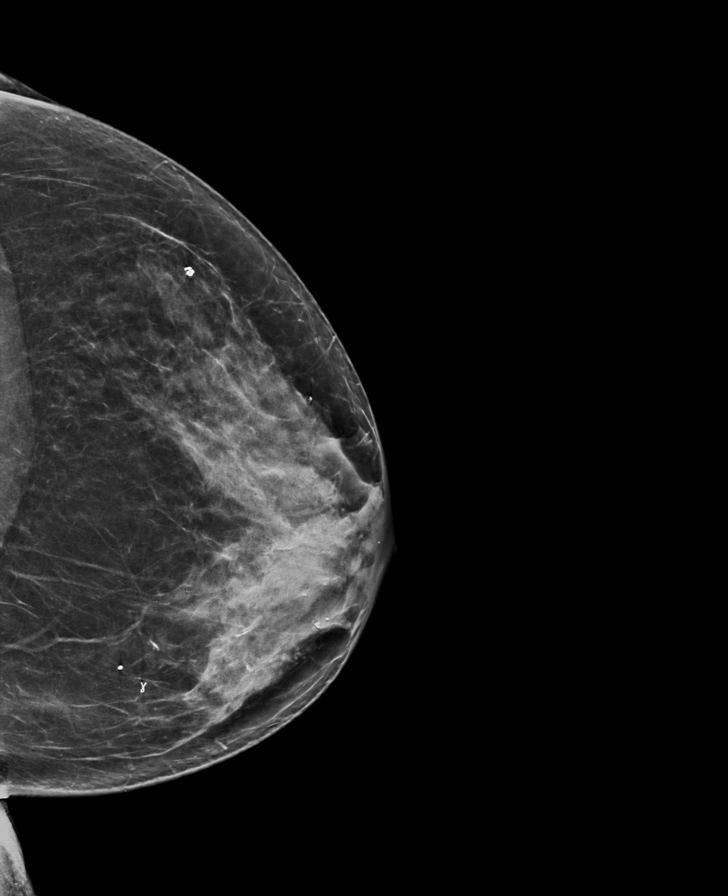

[L MLO synth-2D]
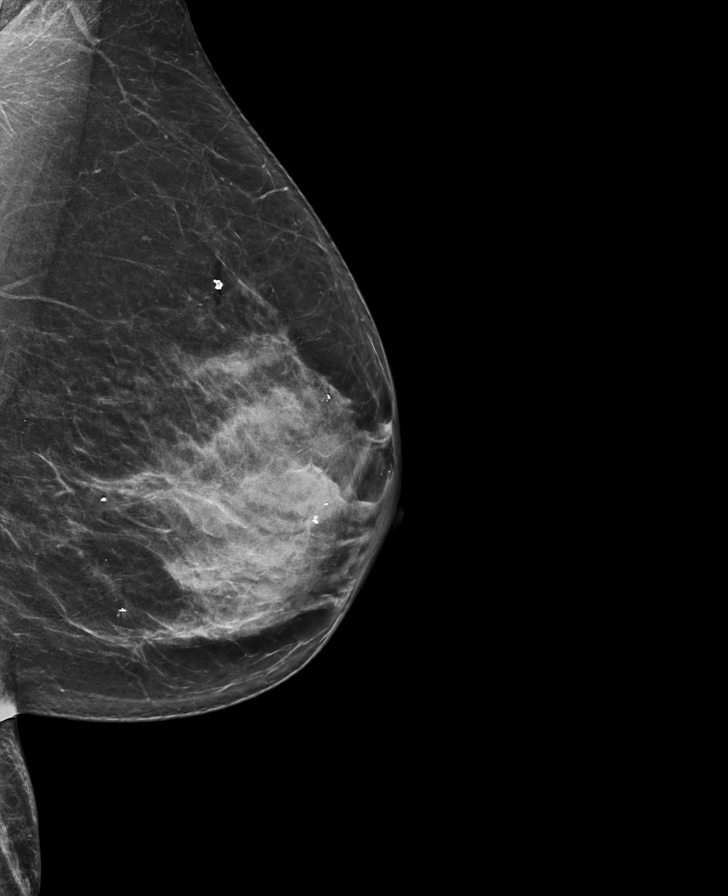

[R MLO synth-2D]
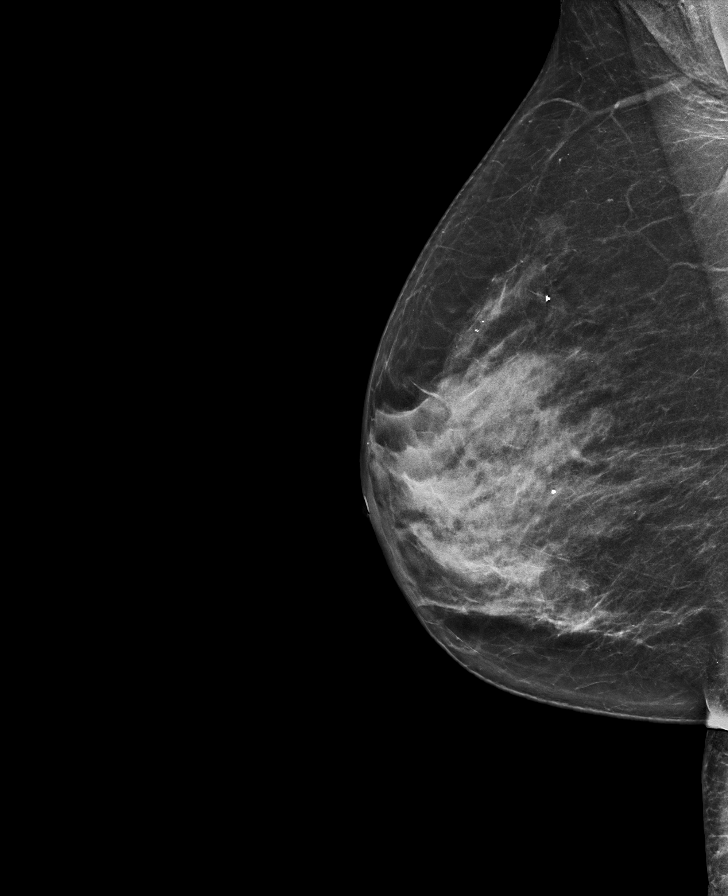

[R MLO tomo · tomo slice 36/71.0]
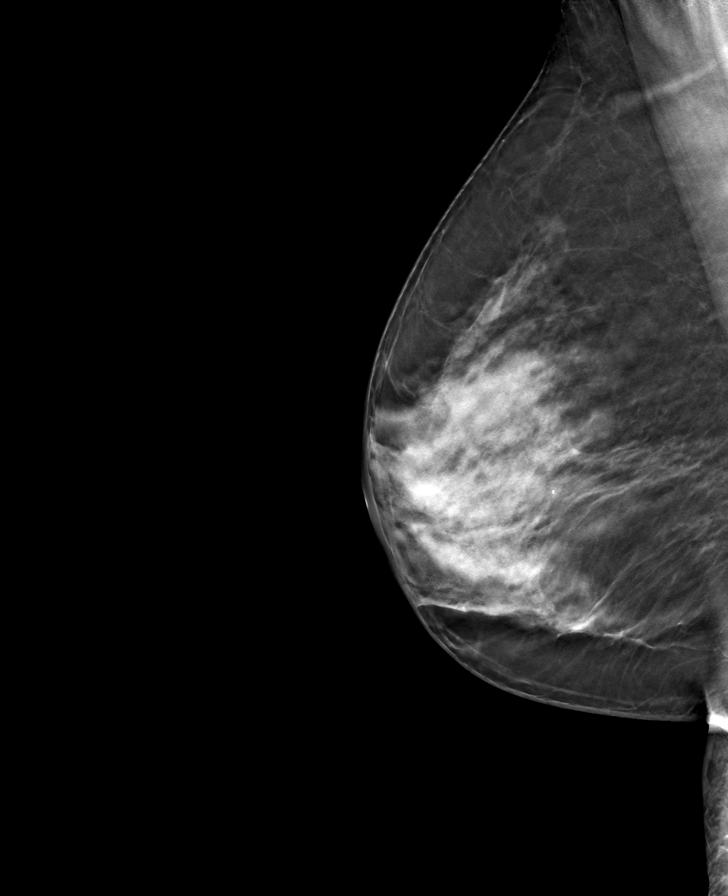

[R CC tomo · tomo slice 35/68.0]
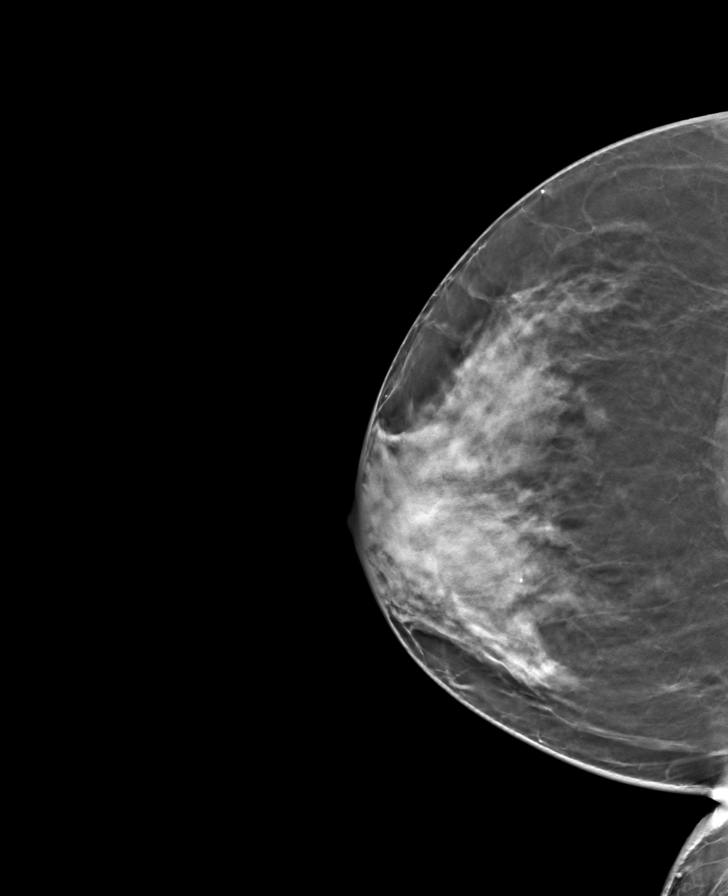

[L CC tomo · tomo slice 37/72.0]
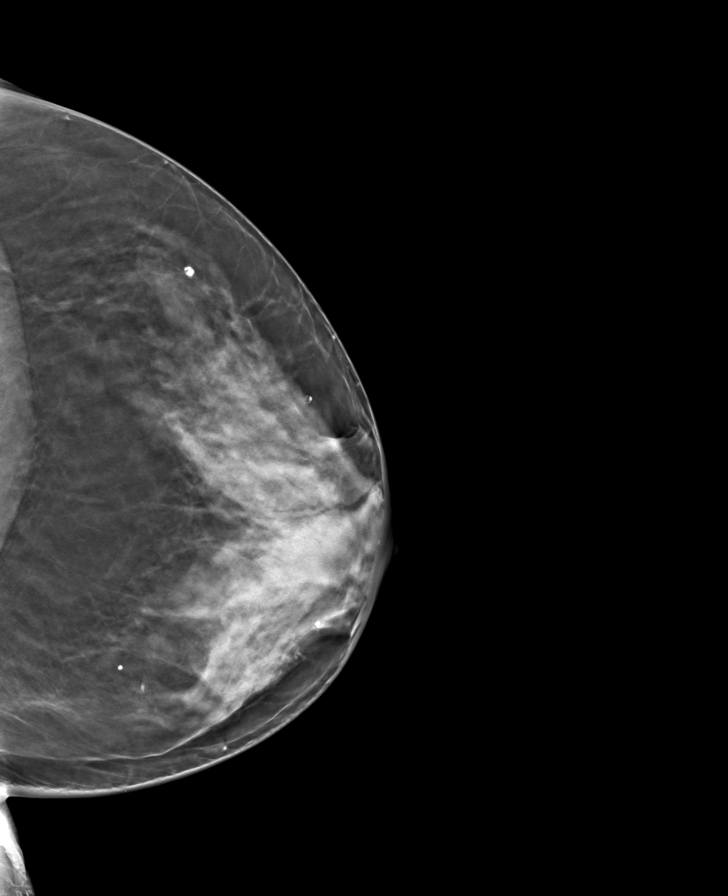

[L MLO tomo · tomo slice 36/71.0]
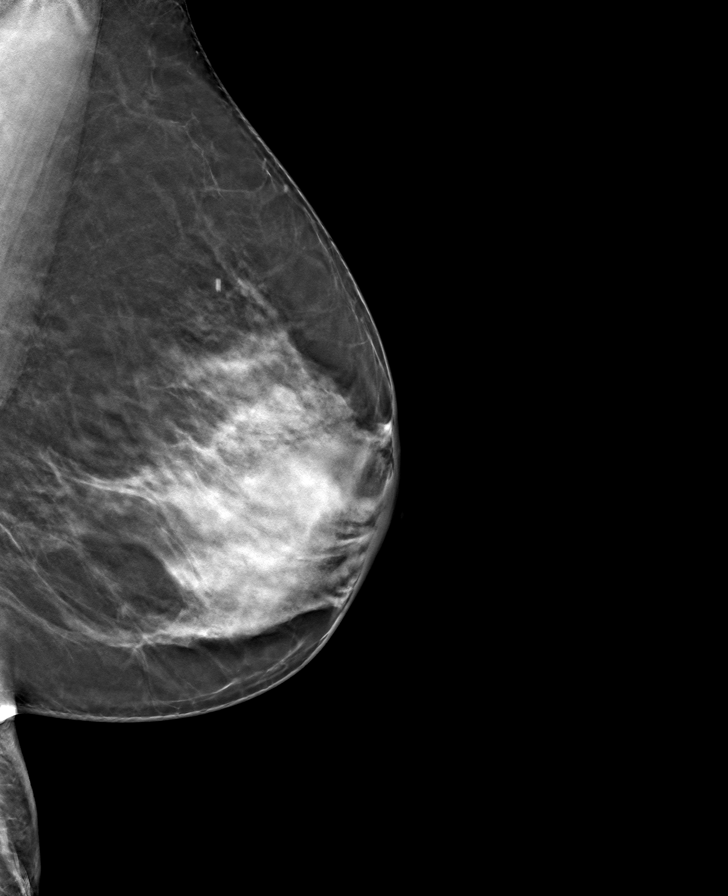

[8 of 24 positions shown; findings below may reference images not displayed]

ACR Breast Density Category c: The breast tissue is heterogeneously
dense, which may obscure small masses.
FINDINGS: There are no findings suspicious for malignancy. Images were
processed with CAD.
IMPRESSION: No mammographic evidence of malignancy. A result letter of this
screening mammogram will be mailed directly to the patient.

RECOMMENDATION:
Screening mammogram in one year. (Code:[id number]-U-LHB)

BI-RADS CATEGORY  1: Negative.

## 2022-06-04 ENCOUNTER — Other Ambulatory Visit: Payer: Self-pay | Admitting: Family Medicine

## 2022-06-04 DIAGNOSIS — Z1231 Encounter for screening mammogram for malignant neoplasm of breast: Secondary | ICD-10-CM

## 2022-07-24 ENCOUNTER — Ambulatory Visit
Admission: RE | Admit: 2022-07-24 | Discharge: 2022-07-24 | Disposition: A | Discharge status: Home / Self Care | Payer: No Typology Code available for payment source | Source: Ambulatory Visit | Attending: Family Medicine | Admitting: Family Medicine

## 2022-07-24 DIAGNOSIS — Z1231 Encounter for screening mammogram for malignant neoplasm of breast: Secondary | ICD-10-CM | POA: Diagnosis present

## 2023-06-25 ENCOUNTER — Telehealth: Payer: Self-pay

## 2023-06-25 ENCOUNTER — Other Ambulatory Visit: Payer: Self-pay

## 2023-06-25 ENCOUNTER — Other Ambulatory Visit: Payer: Self-pay | Admitting: Family Medicine

## 2023-06-25 DIAGNOSIS — Z1211 Encounter for screening for malignant neoplasm of colon: Secondary | ICD-10-CM

## 2023-06-25 DIAGNOSIS — Z1231 Encounter for screening mammogram for malignant neoplasm of breast: Secondary | ICD-10-CM

## 2023-06-25 DIAGNOSIS — Z800 Family history of malignant neoplasm of digestive organs: Secondary | ICD-10-CM

## 2023-06-25 MED ORDER — NA SULFATE-K SULFATE-MG SULF 17.5-3.13-1.6 GM/177ML PO SOLN
1.0000 | Freq: Once | ORAL | 0 refills | Status: AC
Start: 2023-06-25 — End: 2023-06-25

## 2023-06-25 NOTE — Telephone Encounter (Signed)
Gastroenterology Pre-Procedure Review  Request Date: 08/14/23 Requesting Physician: Dr. Allegra Lai  PATIENT REVIEW QUESTIONS: The patient responded to the following health history questions as indicated:    1. Are you having any GI issues? no 2. Do you have a personal history of Polyps? no 3. Do you have a family history of Colon Cancer or Polyps? yes (father colon cancer) 4. Diabetes Mellitus? no 5. Joint replacements in the past 12 months?no 6. Major health problems in the past 3 months?no 7. Any artificial heart valves, MVP, or defibrillator?no    MEDICATIONS & ALLERGIES:    Patient reports the following regarding taking any anticoagulation/antiplatelet therapy:   Plavix, Coumadin, Eliquis, Xarelto, Lovenox, Pradaxa, Brilinta, or Effient? no Aspirin? no  Patient confirms/reports the following medications:  Current Outpatient Medications  Medication Sig Dispense Refill   atenolol (TENORMIN) 25 MG tablet Take 25 mg by mouth daily.     diphenhydrAMINE (BENADRYL ALLERGY) 25 mg capsule Take 1 capsule (25 mg total) by mouth every 6 (six) hours as needed for up to 5 days. 30 capsule 0   famotidine (PEPCID) 20 MG tablet Take 1 tablet (20 mg total) by mouth 2 (two) times daily for 5 days. 10 tablet 1   hydrochlorothiazide (HYDRODIURIL) 25 MG tablet Take 25 mg by mouth daily.     HYDROcodone-acetaminophen (NORCO/VICODIN) 5-325 MG tablet Take 1 tablet by mouth every 6 (six) hours as needed for moderate pain. 20 tablet 0   No current facility-administered medications for this visit.    Patient confirms/reports the following allergies:  Allergies  Allergen Reactions   Sulfa Antibiotics Other (See Comments)    Migraine, red eyes    No orders of the defined types were placed in this encounter.   AUTHORIZATION INFORMATION Primary Insurance: 1D#: Group #:  Secondary Insurance: 1D#: Group #:  SCHEDULE INFORMATION: Date: 08/14/23 Time: Location: [facility]

## 2023-06-25 NOTE — Telephone Encounter (Signed)
Pt requesting call back to schedule colonoscopy.

## 2023-07-29 ENCOUNTER — Ambulatory Visit
Admission: RE | Admit: 2023-07-29 | Discharge: 2023-07-29 | Disposition: A | Discharge status: Home / Self Care | Payer: Managed Care, Other (non HMO) | Source: Ambulatory Visit | Attending: Family Medicine | Admitting: Family Medicine

## 2023-07-29 DIAGNOSIS — Z1231 Encounter for screening mammogram for malignant neoplasm of breast: Secondary | ICD-10-CM | POA: Insufficient documentation

## 2023-08-14 ENCOUNTER — Encounter
Admission: RE | Disposition: A | Discharge status: Home / Self Care | Payer: Self-pay | Source: Home / Self Care | Attending: Gastroenterology

## 2023-08-14 ENCOUNTER — Ambulatory Visit
Admission: RE | Admit: 2023-08-14 | Discharge: 2023-08-14 | Disposition: A | Discharge status: Home / Self Care | Payer: Managed Care, Other (non HMO) | Attending: Gastroenterology | Admitting: Gastroenterology

## 2023-08-14 ENCOUNTER — Ambulatory Visit: Payer: Managed Care, Other (non HMO) | Admitting: Anesthesiology

## 2023-08-14 ENCOUNTER — Encounter: Payer: Self-pay | Admitting: Gastroenterology

## 2023-08-14 DIAGNOSIS — Z1211 Encounter for screening for malignant neoplasm of colon: Secondary | ICD-10-CM | POA: Diagnosis present

## 2023-08-14 DIAGNOSIS — I10 Essential (primary) hypertension: Secondary | ICD-10-CM | POA: Insufficient documentation

## 2023-08-14 DIAGNOSIS — K644 Residual hemorrhoidal skin tags: Secondary | ICD-10-CM | POA: Diagnosis not present

## 2023-08-14 DIAGNOSIS — K635 Polyp of colon: Secondary | ICD-10-CM

## 2023-08-14 DIAGNOSIS — Z800 Family history of malignant neoplasm of digestive organs: Secondary | ICD-10-CM

## 2023-08-14 DIAGNOSIS — D125 Benign neoplasm of sigmoid colon: Secondary | ICD-10-CM | POA: Diagnosis not present

## 2023-08-14 HISTORY — PX: POLYPECTOMY: SHX5525

## 2023-08-14 HISTORY — PX: COLONOSCOPY WITH PROPOFOL: SHX5780

## 2023-08-14 SURGERY — COLONOSCOPY WITH PROPOFOL
Anesthesia: General

## 2023-08-14 MED ORDER — PROPOFOL 500 MG/50ML IV EMUL
INTRAVENOUS | Status: DC | PRN
  Administered 2023-08-14: 80 mg via INTRAVENOUS
  Administered 2023-08-14: 180 ug/kg/min via INTRAVENOUS

## 2023-08-14 MED ORDER — SODIUM CHLORIDE 0.9 % IV SOLN: INTRAVENOUS | Status: DC | PRN

## 2023-08-14 MED ORDER — SODIUM CHLORIDE 0.9 % IV SOLN: INTRAVENOUS | Status: DC

## 2023-08-14 MED ORDER — LIDOCAINE HCL (CARDIAC) PF 100 MG/5ML IV SOSY
PREFILLED_SYRINGE | INTRAVENOUS | Status: DC | PRN
Start: 2023-08-14 — End: 2023-08-14
  Administered 2023-08-14: 50 mg via INTRATRACHEAL

## 2023-08-14 NOTE — H&P (Signed)
Arlyss Repress, MD [address]  [address]  [place], [place] [address]  Main: [phone]  Fax: [phone] Pager: [phone]  Primary Care Physician:  Abram Sander, MD Primary Gastroenterologist:  Dr. Arlyss Repress  Pre-Procedure History & Physical: HPI:  Cynthia Khan is a 62 y.o. female is here for an colonoscopy.   Past Medical History:  Diagnosis Date   Hypertension     Past Surgical History:  Procedure Laterality Date   BREAST BIOPSY Right 2014   benign   BREAST BIOPSY Left 02/11/2018   stereo bx calcs,ribbon clip,FIBROADENOMATOID CHANGE     Prior to Admission medications   Medication Sig Start Date End Date Taking? Authorizing Provider  atenolol (TENORMIN) 25 MG tablet Take 25 mg by mouth daily.   Yes [provider]  hydrochlorothiazide (HYDRODIURIL) 12.5 MG tablet Take 12.5 mg by mouth daily. 05/08/23  Yes [provider]  diphenhydrAMINE (BENADRYL ALLERGY) 25 mg capsule Take 1 capsule (25 mg total) by mouth every 6 (six) hours as needed for up to 5 days. 05/21/18 05/26/18  Orvil Feil, PA-C  famotidine (PEPCID) 20 MG tablet Take 1 tablet (20 mg total) by mouth 2 (two) times daily for 5 days. 05/21/18 05/26/18  Orvil Feil, PA-C  HYDROcodone-acetaminophen (NORCO/VICODIN) 5-325 MG tablet Take 1 tablet by mouth every 6 (six) hours as needed for moderate pain. Patient not taking: Reported on 06/25/2023 05/09/20   Tommi Rumps, PA-C    Allergies as of 06/25/2023 - Review Complete 06/25/2023  Allergen Reaction Noted   Sulfa antibiotics Other (See Comments) 01/04/2018    Family History  Problem Relation Age of Onset   Breast cancer Maternal Grandmother     Social History   Socioeconomic History   Marital status: Divorced    Spouse name: Not on file   Number of children: Not on file   Years of education: Not on file   Highest education level: Not on file  Occupational History   Not on file  Tobacco Use   Smoking status:  Never   Smokeless tobacco: Never  Vaping Use   Vaping status: Never Used  Substance and Sexual Activity   Alcohol use: Not Currently    Alcohol/week: 1.0 standard drink of alcohol    Types: 1 Glasses of wine per week   Drug use: Never   Sexual activity: Not Currently  Other Topics Concern   Not on file  Social History Narrative   Not on file   Social Determinants of Health   Financial Resource Strain: Not on file  Food Insecurity: Not on file  Transportation Needs: Not on file  Physical Activity: Not on file  Stress: Not on file  Social Connections: Not on file  Intimate Partner Violence: Not on file    Review of Systems: See HPI, otherwise negative ROS  Physical Exam: BP (!) 149/83   Pulse 80   Temp (!) 96.7 F (35.9 C) (Temporal)   Resp 16   Ht 5\' 1"  (1.549 m)   Wt 76.1 kg   SpO2 99%   BMI 31.71 kg/m  General:   Alert,  pleasant and cooperative in NAD Head:  Normocephalic and atraumatic. Neck:  Supple; no masses or thyromegaly. Lungs:  Clear throughout to auscultation.    Heart:  Regular rate and rhythm. Abdomen:  Soft, nontender and nondistended. Normal bowel sounds, without guarding, and without rebound.   Neurologic:  Alert and  oriented x4;  grossly normal neurologically.  Impression/Plan: IVORY MAHOWALD is here for an colonoscopy to be performed for colon cancer screening  Risks, benefits, limitations, and alternatives regarding  colonoscopy have been reviewed with the patient.  Questions have been answered.  All parties agreeable.   Lannette Donath, MD  08/14/2023, 10:50 AM

## 2023-08-14 NOTE — Transfer of Care (Signed)
Immediate Anesthesia Transfer of Care Note  Patient: DEEANA GLASPELL  Procedure(s) Performed: COLONOSCOPY WITH PROPOFOL POLYPECTOMY  Patient Location: PACU and Endoscopy Unit  Anesthesia Type:General  Level of Consciousness: drowsy  Airway & Oxygen Therapy: Patient Spontanous Breathing  Post-op Assessment: Report given to RN  Post vital signs: Reviewed and stable  Last Vitals:  Vitals Value Taken Time  BP 108/84 08/14/23 1113  Temp    Pulse 75 08/14/23 1114  Resp 15 08/14/23 1114  SpO2 100 % 08/14/23 1114  Vitals shown include unfiled device data.  Last Pain:  Vitals:   08/14/23 1002  TempSrc: Temporal  PainSc: 0-No pain         Complications: No notable events documented.

## 2023-08-14 NOTE — Anesthesia Preprocedure Evaluation (Signed)
Anesthesia Evaluation  Patient identified by MRN, date of birth, ID band Patient awake    Reviewed: Allergy & Precautions, NPO status , Patient's Chart, lab work & pertinent test results  History of Anesthesia Complications Negative for: history of anesthetic complications  Airway Mallampati: II  TM Distance: >3 FB Neck ROM: full    Dental no notable dental hx.    Pulmonary neg pulmonary ROS   Pulmonary exam normal        Cardiovascular hypertension, On Medications negative cardio ROS Normal cardiovascular exam     Neuro/Psych negative neurological ROS  negative psych ROS   GI/Hepatic negative GI ROS, Neg liver ROS,,,  Endo/Other  negative endocrine ROS    Renal/GU negative Renal ROS  negative genitourinary   Musculoskeletal   Abdominal   Peds  Hematology negative hematology ROS (+)   Anesthesia Other Findings Past Medical History: No date: Hypertension  Past Surgical History: 2014: BREAST BIOPSY; Right     Comment:  benign 02/11/2018: BREAST BIOPSY; Left     Comment:  stereo bx calcs,ribbon clip,FIBROADENOMATOID CHANGE   BMI    Body Mass Index: 31.71 kg/m      Reproductive/Obstetrics negative OB ROS                             Anesthesia Physical Anesthesia Plan  ASA: 2  Anesthesia Plan: General   Post-op Pain Management: Minimal or no pain anticipated   Induction: Intravenous  PONV Risk Score and Plan: 2 and Propofol infusion and TIVA  Airway Management Planned: Natural Airway and Nasal Cannula  Additional Equipment:   Intra-op Plan:   Post-operative Plan:   Informed Consent: I have reviewed the patients History and Physical, chart, labs and discussed the procedure including the risks, benefits and alternatives for the proposed anesthesia with the patient or authorized representative who has indicated his/her understanding and acceptance.     Dental  Advisory Given  Plan Discussed with: Anesthesiologist, CRNA and Surgeon  Anesthesia Plan Comments: (Patient consented for risks of anesthesia including but not limited to:  - adverse reactions to medications - risk of airway placement if required - damage to eyes, teeth, lips or other oral mucosa - nerve damage due to positioning  - sore throat or hoarseness - Damage to heart, brain, nerves, lungs, other parts of body or loss of life  Patient voiced understanding and assent.)       Anesthesia Quick Evaluation

## 2023-08-14 NOTE — Anesthesia Postprocedure Evaluation (Signed)
Anesthesia Post Note  Patient: Cynthia Khan  Procedure(s) Performed: COLONOSCOPY WITH PROPOFOL POLYPECTOMY  Patient location during evaluation: Endoscopy Anesthesia Type: General Level of consciousness: awake and alert Pain management: pain level controlled Vital Signs Assessment: post-procedure vital signs reviewed and stable Respiratory status: spontaneous breathing, nonlabored ventilation, respiratory function stable and patient connected to nasal cannula oxygen Cardiovascular status: blood pressure returned to baseline and stable Postop Assessment: no apparent nausea or vomiting Anesthetic complications: no   No notable events documented.   Last Vitals:  Vitals:   08/14/23 1114 08/14/23 1124  BP: 108/84 (!) 117/90  Pulse: 76 84  Resp: 18 18  Temp:    SpO2: 100% 100%    Last Pain:  Vitals:   08/14/23 1124  TempSrc:   PainSc: 0-No pain                 Louie Boston

## 2023-08-14 NOTE — Op Note (Signed)
[facility]astroenterology Patient Name: Cynthia Khan Procedure Date: 08/14/2023 10:53 AM MRN: [id number] Account #: [id number] Date of Birth: 1961/08/15 Admit Type: Outpatient Age: 62 Room: [facility] ENDO ROOM 1 Gender: Female Note Status: Finalized Instrument Name: Prentice Docker [id number] Procedure:             Colonoscopy Indications:           Screening in patient at increased risk: Colorectal                         cancer in father before age 40, Last colonoscopy: May                         2013 Providers:             Toney Reil MD, MD Referring MD:          Jeris Penta. Adamo (Referring MD) Medicines:             General Anesthesia Complications:         No immediate complications. Estimated blood loss: None. Procedure:             Pre-Anesthesia Assessment:                        - Prior to the procedure, a History and Physical was                         performed, and patient medications and allergies were                         reviewed. The patient is competent. The risks and                         benefits of the procedure and the sedation options and                         risks were discussed with the patient. All questions                         were answered and informed consent was obtained.                         Patient identification and proposed procedure were                         verified by the physician, the nurse, the                         anesthesiologist, the anesthetist and the technician                         in the pre-procedure area in the procedure room in the                         endoscopy suite. Mental Status Examination: alert and                         oriented. Airway Examination: normal oropharyngeal  airway and neck mobility. Respiratory Examination:                         clear to auscultation. CV Examination: normal.                         Prophylactic Antibiotics: The patient does  not require                         prophylactic antibiotics. Prior Anticoagulants: The                         patient has taken no anticoagulant or antiplatelet                         agents. ASA Grade Assessment: II - A patient with mild                         systemic disease. After reviewing the risks and                         benefits, the patient was deemed in satisfactory                         condition to undergo the procedure. The anesthesia                         plan was to use general anesthesia. Immediately prior                         to administration of medications, the patient was                         re-assessed for adequacy to receive sedatives. The                         heart rate, respiratory rate, oxygen saturations,                         blood pressure, adequacy of pulmonary ventilation, and                         response to care were monitored throughout the                         procedure. The physical status of the patient was                         re-assessed after the procedure.                        After obtaining informed consent, the colonoscope was                         passed under direct vision. Throughout the procedure,                         the patient's blood pressure, pulse, and oxygen  saturations were monitored continuously. The                         Colonoscope was introduced through the anus and                         advanced to the the terminal ileum, with                         identification of the appendiceal orifice and IC                         valve. The colonoscopy was performed without                         difficulty. The patient tolerated the procedure well.                         The quality of the bowel preparation was evaluated                         using the BBPS [facility] Bowel Preparation Scale) with                         scores of: Right Colon = 3, Transverse Colon = 3  and                         Left Colon = 3 (entire mucosa seen well with no                         residual staining, small fragments of stool or opaque                         liquid). The total BBPS score equals 9. The terminal                         ileum, ileocecal valve, appendiceal orifice, and                         rectum were photographed. Findings:      The perianal and digital rectal examinations were normal. Pertinent       negatives include normal sphincter tone and no palpable rectal lesions.      A 5 mm polyp was found in the sigmoid colon. The polyp was sessile. The       polyp was removed with a cold snare. Resection and retrieval were       complete. Estimated blood loss: none.      Non-bleeding external hemorrhoids were found during endoscopy. The       hemorrhoids were medium-sized.      The exam was otherwise without abnormality.      The terminal ileum appeared normal. Impression:            - One 5 mm polyp in the sigmoid colon, removed with a                         cold snare. Resected and retrieved.                        -  Non-bleeding external hemorrhoids.                        - The examination was otherwise normal.                        - The examined portion of the ileum was normal. Recommendation:        - Discharge patient to home (with escort).                        - Resume previous diet today.                        - Continue present medications.                        - Await pathology results.                        - Repeat colonoscopy in 5 years for surveillance. Procedure Code(s):     --- Professional ---                        [id number], Colonoscopy, flexible; with removal of                         tumor(s), polyp(s), or other lesion(s) by snare                         technique Diagnosis Code(s):     --- Professional ---                        Z80.0, Family history of malignant neoplasm of                         digestive organs                         D12.5, Benign neoplasm of sigmoid colon                        K64.4, Residual hemorrhoidal skin tags CPT copyright 2022 [employer]. All rights reserved. The codes documented in this report are preliminary and upon coder review may  be revised to meet current compliance requirements. Dr. Libby Maw Toney Reil MD, MD 08/14/2023 11:15:20 AM This report has been signed electronically. Number of Addenda: 0 Note Initiated On: 08/14/2023 10:53 AM Scope Withdrawal Time: 0 hours 5 minutes 13 seconds  Total Procedure Duration: 0 hours 9 minutes 37 seconds  Estimated Blood Loss:  Estimated blood loss: none.      [facility]

## 2023-08-17 ENCOUNTER — Encounter: Payer: Self-pay | Admitting: Gastroenterology

## 2023-08-19 LAB — SURGICAL PATHOLOGY

## 2023-08-21 ENCOUNTER — Encounter: Payer: Self-pay | Admitting: Gastroenterology
